# Patient Record
Sex: Female | Born: 1965 | Race: White | Hispanic: No | Marital: Married | Smoking: Current every day smoker
Health system: Southern US, Community
[De-identification: ages and names within clinical notes are randomized; demographics above are authoritative.]

## PROBLEM LIST (undated history)

## (undated) DIAGNOSIS — I10 Essential (primary) hypertension: Secondary | ICD-10-CM

## (undated) HISTORY — PX: CERVICAL BIOPSY  W/ LOOP ELECTRODE EXCISION: SUR135

---

## 2002-03-19 ENCOUNTER — Encounter: Admission: RE | Admit: 2002-03-19 | Discharge: 2002-03-19 | Payer: Self-pay | Admitting: Internal Medicine

## 2002-03-19 ENCOUNTER — Encounter: Payer: Self-pay | Admitting: Internal Medicine

## 2005-01-13 ENCOUNTER — Encounter: Admission: RE | Admit: 2005-01-13 | Discharge: 2005-01-13 | Payer: Self-pay | Admitting: Obstetrics and Gynecology

## 2006-10-16 ENCOUNTER — Encounter: Admission: RE | Admit: 2006-10-16 | Discharge: 2006-10-16 | Payer: Self-pay | Admitting: Obstetrics and Gynecology

## 2007-12-18 ENCOUNTER — Encounter: Admission: RE | Admit: 2007-12-18 | Discharge: 2007-12-18 | Payer: Self-pay | Admitting: Obstetrics and Gynecology

## 2009-02-16 ENCOUNTER — Encounter: Admission: RE | Admit: 2009-02-16 | Discharge: 2009-02-16 | Payer: Self-pay | Admitting: Obstetrics and Gynecology

## 2009-02-16 IMAGING — MG MM SCREEN MAMMOGRAM BILATERAL
5 series · 5 of 5 positions shown · non-contrast
Comparison: none

DG SCREEN MAMMOGRAM BILATERAL
Bilateral CC and MLO view(s) were taken.

DIGITAL SCREENING MAMMOGRAM WITH CAD:
There are scattered fibroglandular densities.  Possible asymmetry is noted in the left breast.  
Spot compression views and possibly sonography are recommended for further evaluation.  In the 
right breast, no masses or malignant type calcifications are identified.  Compared with prior 
studies.

[R CC]
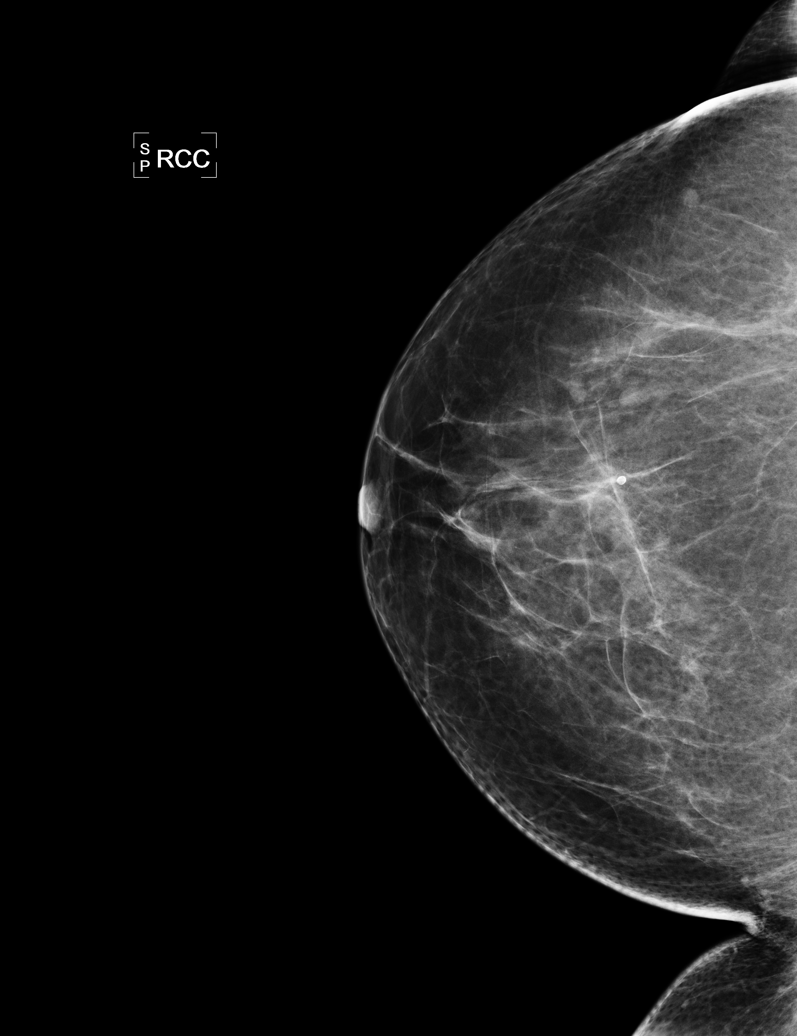

[L CC]
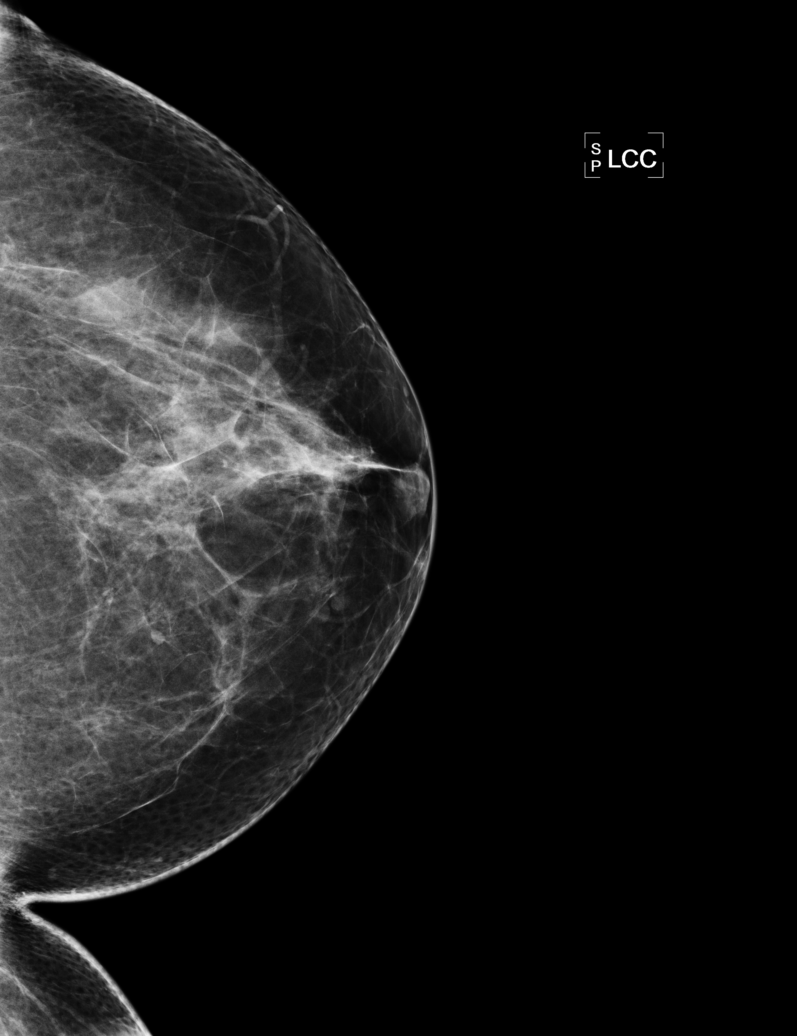

[L MLO]
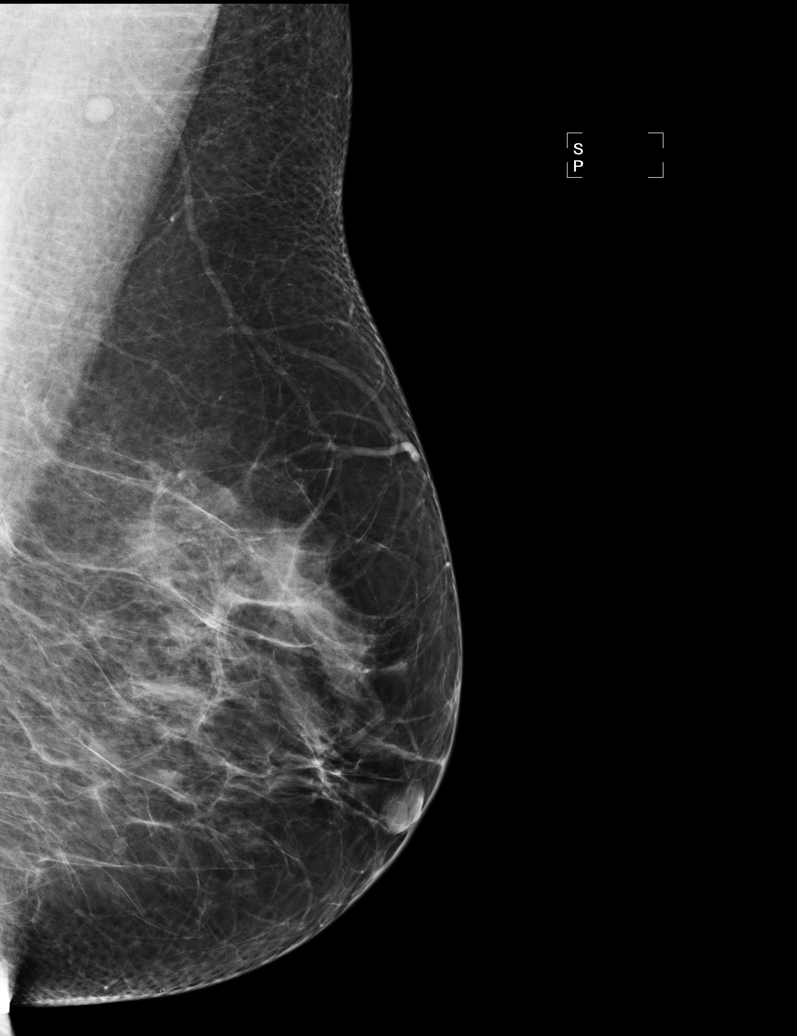

[R MLO (1 of 2)]
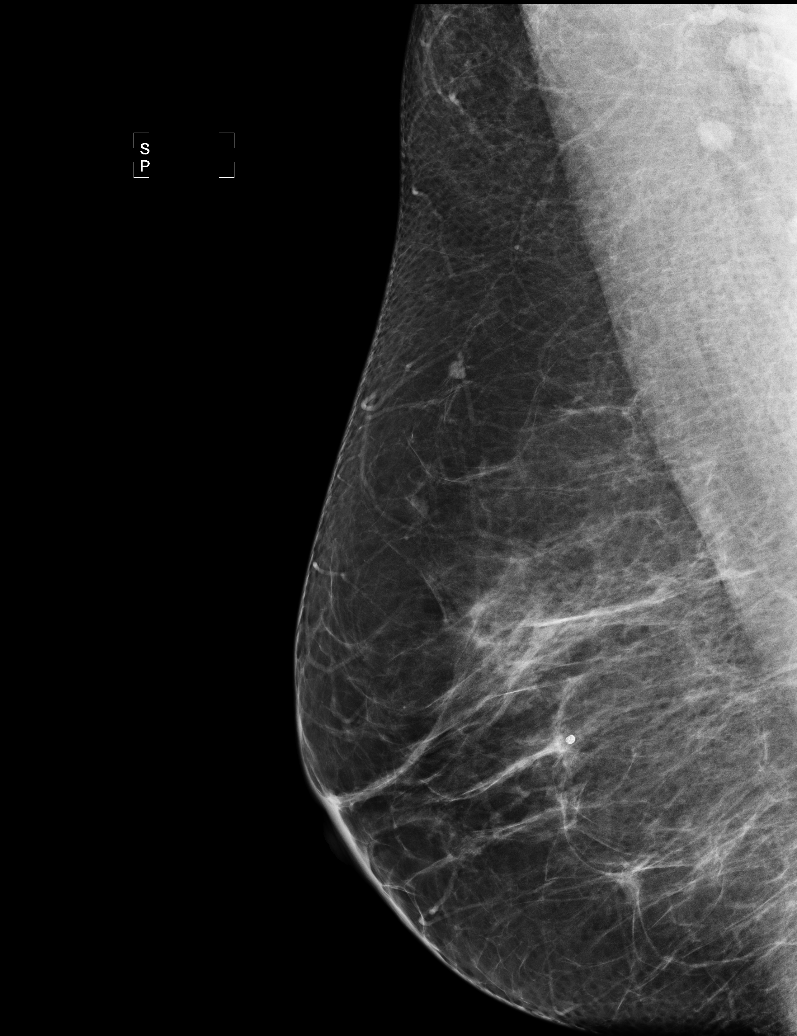

[R MLO (2 of 2)]
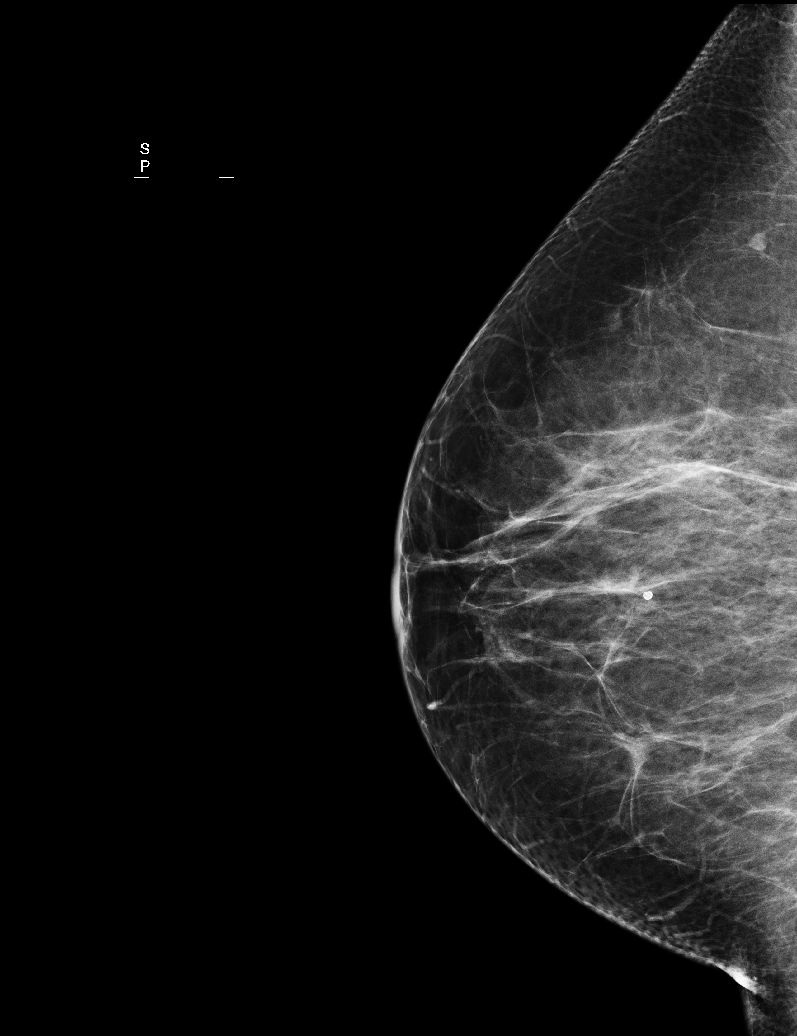

[5 of 5 positions shown; findings below may reference images not displayed]

IMPRESSION: Possible asymmetry, left breast.  Additional evaluation is indicated.  The patient will be 
contacted for additional studies and a supplementary report will follow.  No specific mammographic 
evidence of malignancy, right breast.

ASSESSMENT: Need additional imaging evaluation and/or prior mammograms for comparison - BI-RADS 0

Further imaging of the left breast.
ANALYZED BY COMPUTER AIDED DETECTION. , THIS PROCEDURE WAS A DIGITAL MAMMOGRAM.

## 2009-02-19 ENCOUNTER — Encounter: Admission: RE | Admit: 2009-02-19 | Discharge: 2009-02-19 | Payer: Self-pay | Admitting: Obstetrics and Gynecology

## 2010-03-01 ENCOUNTER — Encounter: Admission: RE | Admit: 2010-03-01 | Discharge: 2010-03-01 | Payer: Self-pay | Admitting: Obstetrics and Gynecology

## 2010-12-11 ENCOUNTER — Encounter: Payer: Self-pay | Admitting: Obstetrics and Gynecology

## 2011-08-25 ENCOUNTER — Other Ambulatory Visit: Payer: Self-pay | Admitting: Family Medicine

## 2011-08-25 DIAGNOSIS — Z1231 Encounter for screening mammogram for malignant neoplasm of breast: Secondary | ICD-10-CM

## 2011-08-29 ENCOUNTER — Ambulatory Visit: Payer: Self-pay

## 2013-09-10 ENCOUNTER — Telehealth (HOSPITAL_COMMUNITY): Payer: Self-pay | Admitting: *Deleted

## 2013-09-10 NOTE — Telephone Encounter (Signed)
Telephoned patient at home # and left message to return call to [facility] 

## 2013-09-25 ENCOUNTER — Other Ambulatory Visit: Payer: Self-pay | Admitting: Obstetrics and Gynecology

## 2013-09-25 DIAGNOSIS — Z1231 Encounter for screening mammogram for malignant neoplasm of breast: Secondary | ICD-10-CM

## 2013-10-21 ENCOUNTER — Ambulatory Visit (HOSPITAL_COMMUNITY): Payer: Self-pay

## 2013-10-21 ENCOUNTER — Ambulatory Visit (HOSPITAL_COMMUNITY): Payer: No Typology Code available for payment source | Attending: Obstetrics and Gynecology

## 2013-12-09 ENCOUNTER — Ambulatory Visit (HOSPITAL_COMMUNITY)
Admission: RE | Admit: 2013-12-09 | Discharge: 2013-12-09 | Disposition: A | Discharge status: Home / Self Care | Payer: No Typology Code available for payment source | Source: Ambulatory Visit | Attending: Obstetrics and Gynecology | Admitting: Obstetrics and Gynecology

## 2013-12-09 ENCOUNTER — Encounter (HOSPITAL_COMMUNITY): Payer: Self-pay

## 2013-12-09 VITALS — BP 144/96 | Temp 98.3°F | Ht 61.0 in | Wt 153.4 lb

## 2013-12-09 DIAGNOSIS — Z1239 Encounter for other screening for malignant neoplasm of breast: Secondary | ICD-10-CM

## 2013-12-09 DIAGNOSIS — Z1231 Encounter for screening mammogram for malignant neoplasm of breast: Secondary | ICD-10-CM

## 2013-12-09 HISTORY — DX: Essential (primary) hypertension: I10

## 2013-12-09 NOTE — Progress Notes (Signed)
No complaints today.  Pap Smear:    Pap smear not completed today. Last Pap smear was March 2013 at the free [facility] screening [facility] and normal. Per patient has a history of an abnormal Pap smear in 1989 that required cryo and LEEP for follow up. Per patient all Pap smears have bee normal since the LEEP was completed. No Pap smear results in EPIC.  Physical exam: Breasts Breasts symmetrical. No skin abnormalities bilateral breasts. No nipple retraction bilateral breasts. No nipple discharge bilateral breasts. No lymphadenopathy. No lumps palpated bilateral breasts. No complaints of pain or tenderness on exam. Patient escorted to mammography for a screening mammogram.        Pelvic/Bimanual No Pap smear completed today since last Pap smear was March 2013. Pap smear not indicated per [facility] guidelines.

## 2013-12-09 NOTE — Patient Instructions (Signed)
Taught Kathleen Brady how to perform BSE and gave educational materials to take home. Patient did not need a Pap smear today due to last Pap smear was in March 2013 per patient. Let her know [facility] will cover Pap smears every 3 years unless has a history of abnormal Pap smears. Let patient know will follow up with her within the next couple weeks with results by letter or phone. Smoking cessation discussed. Kathleen Brady verbalized understanding. Patient escorted to mammography for a screening mammogram.  Fraida Veldman, Kathaleen Maserhristine Poll, RN 8:32 AM

## 2013-12-10 ENCOUNTER — Other Ambulatory Visit: Payer: Self-pay | Admitting: Obstetrics and Gynecology

## 2013-12-10 DIAGNOSIS — R928 Other abnormal and inconclusive findings on diagnostic imaging of breast: Secondary | ICD-10-CM

## 2013-12-12 ENCOUNTER — Other Ambulatory Visit: Payer: Self-pay

## 2013-12-12 ENCOUNTER — Ambulatory Visit: Payer: Self-pay

## 2013-12-18 ENCOUNTER — Ambulatory Visit: Payer: Self-pay

## 2013-12-26 ENCOUNTER — Other Ambulatory Visit: Payer: No Typology Code available for payment source

## 2013-12-26 ENCOUNTER — Ambulatory Visit (HOSPITAL_BASED_OUTPATIENT_CLINIC_OR_DEPARTMENT_OTHER): Payer: No Typology Code available for payment source

## 2013-12-26 VITALS — BP 148/106 | HR 62 | Temp 98.8°F | Resp 18 | Ht 61.0 in | Wt 152.0 lb

## 2013-12-26 DIAGNOSIS — I10 Essential (primary) hypertension: Secondary | ICD-10-CM

## 2013-12-26 LAB — LIPID PANEL
CHOL/HDL RATIO: 4.1 ratio
Cholesterol: 239 mg/dL — ABNORMAL HIGH (ref 0–200)
HDL: 58 mg/dL (ref >39)
LDL Cholesterol: 156 mg/dL — ABNORMAL HIGH (ref 0–99)
Triglycerides: 124 mg/dL (ref <150)
VLDL: 25 mg/dL (ref 0–40)

## 2013-12-26 LAB — GLUCOSE (CC13): Glucose: 82 mg/dl (ref 70–140)

## 2013-12-26 LAB — HEMOGLOBIN A1C
Hgb A1c MFr Bld: 5.6 % (ref <5.7)
MEAN PLASMA GLUCOSE: 114 mg/dL (ref <117)

## 2013-12-26 NOTE — Patient Instructions (Signed)
Discussed health assessment with patient. Talked with patient about smoking cessation and gave resources. Signed patient up for the next smoking cessation class [facility] starting Monday, January 19, 2014. Gave patient all the dates, times and location. I told patient that would refer [facility] to [facility] for high blood pressure. Informed patient to go there after her labs have been drawn. Let patient know that will call her to follow up and if have any questions to call me. Discussed possibility of going to weigh watchers and smoking. Patient verbalized understanding.

## 2013-12-26 NOTE — Progress Notes (Signed)
Patient [facility] a [facility] and is currently a [facility] patient effective 12/09/2013.  Clinical Measurements: Patient is 5 ft. half inch, weight 151.8 lbs, waist circumference 37 inches, and hip circumference 39.5inches.   Medical History: Patient has no history of high cholesterol that she knows of. Patient does have a history of hypertension and can not remember what the medication is. She has not taken medication in 3 months.She states that BP medication breaks her out in hives. Per patient she stated meant to bring medication in today. Patient is not a diabetic and has no history of diabetes. Per patient no diagnosed history of coronary heart disease, heart attack, heart failure, stroke/TIA, vascular disease or congenital heart defects.   Blood Pressure, Self-measurement: Patient states has been shown how to take BP but does not measure daily as previously recommended. Patients states that checks it when her face feels red or that she feels BP may be up.  Nutrition Assessment: Patient stated that she drinks around two glasses of orange juice every day. Patient states she eata at least 2 cups of vegetables  daily. Patient states that eats 3 oz of of whole grains a week. Patient doesn't eat two or more servings of fish weekly was not good one day. She stated that gets stressed.. Patient states she does drink more than 36 ounces or 450 calories of beverages with added sugars weekly. Patient stated she does watch her salt intake not adding salt to foods due to her history of hypertension.   Physical Activity Assessment: Patient stated she does a lot of walking and working around the house.patient states that does not get any vigorous exercising done a week.  Smoking Status: Patient is a smoker and smoke a half to one pack per day of cigerettes. Patient is exposed to smoke a 1 hour a day. Patient asked to be signed  Up for smoking cessation class starting March 2.  Quality of Life  Assessment: In assessing patient's quality of life she stated that out of the past 30 days that she has felt her health is good all of them. Patient also stated that in the past 30 days that her mental health is not good including stress, depression and problems with emotions. Patient stated that she has  depression and is currently on Celexa 40 mg everyday and Xanax 25 mg every 24 hrs.as needed. 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 Patient did stated that out of the past 30 days that she felt none of her physical or mental health kept her from doing her usual activities including self-care, work or recreation.   Plan: Lab work will be done today including a lipid panel, blood glucose, and Hgb A1C. Will call lab results when they are finished. Patient will returned on February 12th for Health Coaching. After reviewing labs will schedule [facility] for Hypertension and whatever is needed.

## 2013-12-29 ENCOUNTER — Telehealth: Payer: Self-pay

## 2013-12-29 NOTE — Telephone Encounter (Addendum)
Called to inform about lab work from 12/26/13. I informed patient on following labs : cholesterol- 239, HDL- 58, LDL- 156,  triglycerides - 124, Bld Glucose -81 and HBG-A1C - 5.6.   Informed her that I had call for an appointment at [facility] and [facility]. They stated that would get back with pstient with an appointment. We changed her Health Coaching from the 12 th due to breast imaging on the 12 th until 01/08/14 @2pm .

## 2014-01-01 ENCOUNTER — Ambulatory Visit
Admission: RE | Admit: 2014-01-01 | Discharge: 2014-01-01 | Disposition: A | Discharge status: Home / Self Care | Payer: Self-pay | Source: Ambulatory Visit | Attending: Obstetrics and Gynecology | Admitting: Obstetrics and Gynecology

## 2014-01-01 ENCOUNTER — Ambulatory Visit
Admission: RE | Admit: 2014-01-01 | Discharge: 2014-01-01 | Disposition: A | Discharge status: Home / Self Care | Payer: No Typology Code available for payment source | Source: Ambulatory Visit | Attending: Obstetrics and Gynecology | Admitting: Obstetrics and Gynecology

## 2014-01-01 DIAGNOSIS — R928 Other abnormal and inconclusive findings on diagnostic imaging of breast: Secondary | ICD-10-CM

## 2014-01-08 ENCOUNTER — Telehealth: Payer: Self-pay

## 2014-01-08 ENCOUNTER — Encounter: Payer: Self-pay | Admitting: Internal Medicine

## 2014-01-08 ENCOUNTER — Ambulatory Visit: Payer: Self-pay | Attending: Internal Medicine | Admitting: Internal Medicine

## 2014-01-08 ENCOUNTER — Ambulatory Visit (HOSPITAL_BASED_OUTPATIENT_CLINIC_OR_DEPARTMENT_OTHER): Payer: No Typology Code available for payment source

## 2014-01-08 VITALS — BP 176/117 | HR 86 | Temp 98.9°F | Resp 14 | Ht 61.0 in | Wt 150.0 lb

## 2014-01-08 VITALS — BP 138/86

## 2014-01-08 DIAGNOSIS — I10 Essential (primary) hypertension: Secondary | ICD-10-CM

## 2014-01-08 DIAGNOSIS — Z888 Allergy status to other drugs, medicaments and biological substances status: Secondary | ICD-10-CM | POA: Insufficient documentation

## 2014-01-08 DIAGNOSIS — Z0000 Encounter for general adult medical examination without abnormal findings: Secondary | ICD-10-CM

## 2014-01-08 DIAGNOSIS — Z789 Other specified health status: Secondary | ICD-10-CM

## 2014-01-08 DIAGNOSIS — F17200 Nicotine dependence, unspecified, uncomplicated: Secondary | ICD-10-CM | POA: Insufficient documentation

## 2014-01-08 LAB — COMPLETE METABOLIC PANEL WITH GFR
ALT: 25 U/L (ref 0–35)
AST: 23 U/L (ref 0–37)
Albumin: 4.5 g/dL (ref 3.5–5.2)
Alkaline Phosphatase: 110 U/L (ref 39–117)
BILIRUBIN TOTAL: 0.4 mg/dL (ref 0.2–1.2)
BUN: 13 mg/dL (ref 6–23)
CHLORIDE: 104 meq/L (ref 96–112)
CO2: 30 mEq/L (ref 19–32)
CREATININE: 0.74 mg/dL (ref 0.50–1.10)
Calcium: 9.3 mg/dL (ref 8.4–10.5)
GFR, Est African American: >89 mL/min
GFR, Est Non African American: >89 mL/min
Glucose, Bld: 91 mg/dL (ref 70–99)
Potassium: 4.6 mEq/L (ref 3.5–5.3)
Sodium: 141 mEq/L (ref 135–145)
Total Protein: 7.3 g/dL (ref 6.0–8.3)

## 2014-01-08 LAB — CBC WITH DIFFERENTIAL/PLATELET
BASOS ABS: 0 10*3/uL (ref 0.0–0.1)
Basophils Relative: 0 % (ref 0–1)
EOS ABS: 0.2 10*3/uL (ref 0.0–0.7)
EOS PCT: 2 % (ref 0–5)
HCT: 46.8 % — ABNORMAL HIGH (ref 36.0–46.0)
Hemoglobin: 15.6 g/dL — ABNORMAL HIGH (ref 12.0–15.0)
LYMPHS PCT: 35 % (ref 12–46)
Lymphs Abs: 2.8 10*3/uL (ref 0.7–4.0)
MCH: 31.8 pg (ref 26.0–34.0)
MCHC: 33.3 g/dL (ref 30.0–36.0)
MCV: 95.3 fL (ref 78.0–100.0)
Monocytes Absolute: 0.6 10*3/uL (ref 0.1–1.0)
Monocytes Relative: 7 % (ref 3–12)
NEUTROS PCT: 56 % (ref 43–77)
Neutro Abs: 4.4 10*3/uL (ref 1.7–7.7)
PLATELETS: 262 10*3/uL (ref 150–400)
RBC: 4.91 MIL/uL (ref 3.87–5.11)
RDW: 13.4 % (ref 11.5–15.5)
WBC: 7.9 10*3/uL (ref 4.0–10.5)

## 2014-01-08 LAB — TSH: TSH: 0.958 u[IU]/mL (ref 0.350–4.500)

## 2014-01-08 MED ORDER — CLONIDINE HCL 0.1 MG PO TABS: 0.2000 mg | ORAL_TABLET | Freq: Once | ORAL | Status: DC

## 2014-01-08 MED ORDER — HYDROCHLOROTHIAZIDE 25 MG PO TABS: 25.0000 mg | ORAL_TABLET | Freq: Every day | ORAL | Status: DC

## 2014-01-08 NOTE — Progress Notes (Signed)
Patient is here to establish care. Has a history of Hypertension. Has been off medication x9 months. BP today 176/117. Patient may have an allergic reaction to Lisinopril; arms broke out in hives and swelling. Patient requests to be put back on Hypertension medication.

## 2014-01-08 NOTE — Progress Notes (Signed)
Patient ID: Kathleen Brady, female   DOB: February 17, 1966, 48 y.o.   MRN: [id number]   CC:  HPI: 48 year old he will here to establish care. She was taking lisinopril and developed an allergic reaction including hives and stopped taking it. Blood pressure is elevated today in the 170s. She just received clonidine 0.2 mg. I reviewed her cholesterol panel and her mammogram on 12/09/13. Since then the patient has had a repeat breast ultrasound and was told that she has fibrocystic disease. She supposed to get a repeat ultrasound in 6 months.  She is a smoker and smokes a pack a day for the last 15 years. Denies use of any alcohol Family history reviewed and the patient states that there is no significant family history to report  Patient stated she [facility], she is due for it next year.   Allergies  Allergen Reactions  . Lisinopril Hives and Swelling  . Tylox [Oxycodone-Acetaminophen]    Past Medical History  Diagnosis Date  . Hypertension    No current outpatient prescriptions on file prior to visit.   No current facility-administered medications on file prior to visit.   Family History  Problem Relation Age of Onset  . Breast cancer Maternal Aunt   . Breast cancer Maternal Aunt    History   Social History  . Marital Status: Married    Spouse Name: N/A    Number of Children: N/A  . Years of Education: N/A   Occupational History  . Not on file.   Social History Main Topics  . Smoking status: Current Every Day Smoker -- 0.50 packs/day for 22 years  . Smokeless tobacco: Never Used  . Alcohol Use: No  . Drug Use: No  . Sexual Activity: Not Currently    Birth Control/ Protection: None   Other Topics Concern  . Not on file   Social History Narrative  . No narrative on file    Review of Systems  Constitutional: As in history of present illness  HENT: Negative for ear pain, nosebleeds, congestion, facial swelling, rhinorrhea,  neck pain, neck stiffness and ear discharge.   Eyes: Negative for pain, discharge, redness, itching and visual disturbance.  Respiratory: Negative for cough, choking, chest tightness, shortness of breath, wheezing and stridor.   Cardiovascular: Negative for chest pain, palpitations and leg swelling.  Gastrointestinal: Negative for abdominal distention.  Genitourinary: Negative for dysuria, urgency, frequency, hematuria, flank pain, decreased urine volume, difficulty urinating and dyspareunia.  Musculoskeletal: Negative for back pain, joint swelling, arthralgias and gait problem.  Neurological: Negative for dizziness, tremors, seizures, syncope, facial asymmetry, speech difficulty, weakness, light-headedness, numbness and headaches.  Hematological: Negative for adenopathy. Does not bruise/bleed easily.  Psychiatric/Behavioral: Negative for hallucinations, behavioral problems, confusion, dysphoric mood, decreased concentration and agitation.    Objective:   Filed Vitals:   01/08/14 1211  BP: 176/117  Pulse: 86  Temp: 98.9 F (37.2 C)  Resp: 14    Physical Exam  Constitutional: Appears well-developed and well-nourished. No distress.  HENT: Normocephalic. External right and left ear normal. Oropharynx is clear and moist.  Eyes: Conjunctivae and EOM are normal. PERRLA, no scleral icterus.  Neck: Normal ROM. Neck supple. No JVD. No tracheal deviation. No thyromegaly.  CVS: RRR, S1/S2 +, no murmurs, no gallops, no carotid bruit.  Pulmonary: Effort and breath sounds normal, no stridor, rhonchi, wheezes, rales.  Abdominal: Soft. BS +,  no distension, tenderness, rebound or guarding.  Musculoskeletal:  Normal range of motion. No edema and no tenderness.  Lymphadenopathy: No lymphadenopathy noted, cervical, inguinal. Neuro: Alert. Normal reflexes, muscle tone coordination. No cranial nerve deficit. Skin: Skin is warm and dry. No rash noted. Not diaphoretic. No erythema. No pallor.   Psychiatric: Normal mood and affect. Behavior, judgment, thought content normal.   No results found for this basename: WBC, HGB, HCT, MCV, PLT   No results found for this basename: CREATININE, BUN, NA, K, CL, CO2    Lab Results  Component Value Date   HGBA1C 5.6 12/26/2013   Lipid Panel     Component Value Date/Time   CHOL 239* 12/26/2013 1459   TRIG 124 12/26/2013 1459   HDL 58 12/26/2013 1459   CHOLHDL 4.1 12/26/2013 1459   VLDL 25 12/26/2013 1459   LDLCALC 156* 12/26/2013 1459       Assessment and plan:   There are no active problems to display for this patient.  Hypertension Switch patient to hydrochlorothiazide Will schedule her for repeat CMP in 2 weeks Smoking cessation counseling done   Establish care Baseline labs Pap smear due next year Repeat breast ultrasound due in 6 months and mammogram in one year Up-to-date with hepatitis B Refuses flu vaccination Would like to receive tetanus vaccination today     Follow up in 3 months   The patient was given clear instructions to go to ER or return to medical center if symptoms don't improve, worsen or new problems develop. The patient verbalized understanding. The patient was told to call to get any lab results if not heard anything in the next week.

## 2014-01-08 NOTE — Progress Notes (Signed)
Patient returns today for Health Coaching regarding Hypertension, Nutrition, and Cholesterol.  HYPERTENSION: Per patient she went to [facility] at 11:30 today and her BP was very elevated (176/117). Stated that took BP twice and this result was the lower one. Per patient they gave her a BP medication but did not recheck blood pressure before left office. Rechecked BP at this time was 138/86. Discussed hypertension: cause, effects, treatment, healthy weight, eating right and medication. She was given clonidine 0.2mg  at office and was to start daily HCTZ 25 mg daily. We discussed how the  Medication works. Patient was given the following pamphlets: Your Guide to Lowering Blood Pressure, Dash diet and pill box. We looked at pamphlets and discussed. Patient informed about not taking HCTZ in evening due to would be up voiding all night, possibly. Discussed about getting BP monitor to check BP daily.  NUTRITION: Patient states that would like to go to Weight Watchers. She was given handouts with locations nearest her house. Informed her a little on how Weight Watchers works. Stated that I would have to get back to her when I obtained voucher.  SMOKING CESSATION: Per patient states that has got to quit smoking. Patient is signed up to start QuitSmart class on March 2nd. Discussed a little about the class from patient's questions.  CHOLESTEROL AND LDL's: Gave patient two handouts: HDL Cholesterol: How to boost your good cholesterol and Top Five Foods to Lower Your Numbers. Reviewed all handouts together and asked questions. Patient does not like fish so she asked about an omega-3 supplement. Informed that it probably would be good. Ask doctor to make sure.  HEALTH CARE: Discussed the need for Primary Care Physican (PCP).  [facility]. Discussed the process of obtaining a [employer] Card [employer](Orange Card).

## 2014-01-08 NOTE — Telephone Encounter (Signed)
Patient called to say that she was going to [facility] at [facility] and would be at Health Coaching for [facility] today (01/08/14).

## 2014-01-08 NOTE — Patient Instructions (Addendum)
PLAN: Receive Weight Watchers voucher and start program for 12 weeks. Obtain an Orange card for PCP. Obtain BP monitor and learn how to check blood pressure. Work on reducing Cholesterol. Monitor activity with pedometer. Monitor salt intake. Attend smoking cessation classes. Quit Smoking. Lose to a healthy weight. Will call for follow up talk and  then half way through Weight Watchers meet again. Follow up phone call after Weight Watchers. Patient stated that she understood. Was told could call me for any further questions.

## 2014-01-09 ENCOUNTER — Telehealth: Payer: Self-pay | Admitting: *Deleted

## 2014-01-09 LAB — VITAMIN D 25 HYDROXY (VIT D DEFICIENCY, FRACTURES): VIT D 25 HYDROXY: 15 ng/mL — AB (ref 30–89)

## 2014-01-09 MED ORDER — VITAMIN D (ERGOCALCIFEROL) 1.25 MG (50000 UNIT) PO CAPS: 50000.0000 [IU] | ORAL_CAPSULE | ORAL | Status: DC

## 2014-01-09 NOTE — Telephone Encounter (Signed)
Left patient a voicemail to return our call. 

## 2014-01-09 NOTE — Telephone Encounter (Signed)
Message copied by Ranon Coven, [place] R on Fri Jan 09, 2014 12:06 PM ------      Message from: Susie CassetteABROL MD, Germain OsgoodNAYANA      Created: Fri Jan 09, 2014 11:54 AM       Patient labs are normal, vitamin D 15, please call in a prescription for vitamin D 50,000 units weekly, 10 tablets with 2 refills ------

## 2014-01-15 ENCOUNTER — Other Ambulatory Visit: Payer: No Typology Code available for payment source

## 2014-01-19 ENCOUNTER — Telehealth: Payer: Self-pay | Admitting: *Deleted

## 2014-01-19 NOTE — Telephone Encounter (Signed)
Patient called wanting to know her labs from 2\19. It looks like patient was called and LVM to call back. She was given message from 2\20 in regards to this

## 2014-04-03 ENCOUNTER — Other Ambulatory Visit: Payer: Self-pay | Admitting: Internal Medicine

## 2014-04-06 ENCOUNTER — Other Ambulatory Visit: Payer: Self-pay | Admitting: Internal Medicine

## 2014-04-06 DIAGNOSIS — I10 Essential (primary) hypertension: Secondary | ICD-10-CM

## 2014-04-06 DIAGNOSIS — E559 Vitamin D deficiency, unspecified: Secondary | ICD-10-CM

## 2014-04-06 NOTE — Telephone Encounter (Signed)
Pt called requesting a refill of her blood pressure medication and her Vitamin D medication. Please contact pt

## 2014-04-07 MED ORDER — VITAMIN D (ERGOCALCIFEROL) 1.25 MG (50000 UNIT) PO CAPS: 50000.0000 [IU] | ORAL_CAPSULE | ORAL | Status: AC

## 2014-04-07 MED ORDER — HYDROCHLOROTHIAZIDE 25 MG PO TABS: 25.0000 mg | ORAL_TABLET | Freq: Every day | ORAL | Status: DC

## 2014-05-06 ENCOUNTER — Telehealth: Payer: Self-pay

## 2014-05-06 NOTE — Telephone Encounter (Signed)
Patient called to state that could not arrange work schedule so could come to smoking cessation classes. Patient asked to be signed up for next class. Explained to patient that weight watcher vouchers had not come in yet.

## 2014-05-19 ENCOUNTER — Telehealth: Payer: Self-pay

## 2014-05-19 NOTE — Telephone Encounter (Signed)
Left message, need to discuss Weight Watchers and Smoking Cessation

## 2014-05-21 ENCOUNTER — Telehealth: Payer: Self-pay

## 2014-05-21 NOTE — Telephone Encounter (Signed)
Returned call and patient stated that still want to quit smoking by coming to class. When asked about Weight Watchers patient stated that would still like to go .   PLAN: Made appointment for health coaching follow up for Weight Watchers and smoking.

## 2014-05-29 ENCOUNTER — Ambulatory Visit: Payer: No Typology Code available for payment source

## 2014-06-01 ENCOUNTER — Other Ambulatory Visit: Payer: Self-pay

## 2014-06-01 ENCOUNTER — Other Ambulatory Visit: Payer: Self-pay | Admitting: Obstetrics and Gynecology

## 2014-06-01 DIAGNOSIS — N6320 Unspecified lump in the left breast, unspecified quadrant: Secondary | ICD-10-CM

## 2014-06-30 ENCOUNTER — Other Ambulatory Visit: Payer: Self-pay | Admitting: Obstetrics and Gynecology

## 2014-06-30 ENCOUNTER — Other Ambulatory Visit: Payer: Self-pay

## 2014-06-30 DIAGNOSIS — N6320 Unspecified lump in the left breast, unspecified quadrant: Secondary | ICD-10-CM

## 2014-07-03 ENCOUNTER — Other Ambulatory Visit: Payer: No Typology Code available for payment source

## 2014-07-16 ENCOUNTER — Other Ambulatory Visit: Payer: No Typology Code available for payment source

## 2014-07-22 ENCOUNTER — Other Ambulatory Visit: Payer: No Typology Code available for payment source

## 2014-08-03 ENCOUNTER — Other Ambulatory Visit: Payer: No Typology Code available for payment source

## 2014-08-26 ENCOUNTER — Other Ambulatory Visit: Payer: No Typology Code available for payment source

## 2014-09-10 ENCOUNTER — Other Ambulatory Visit: Payer: No Typology Code available for payment source

## 2014-09-21 ENCOUNTER — Encounter: Payer: Self-pay | Admitting: Internal Medicine

## 2014-10-12 ENCOUNTER — Other Ambulatory Visit: Payer: No Typology Code available for payment source

## 2014-10-27 ENCOUNTER — Other Ambulatory Visit: Payer: No Typology Code available for payment source

## 2014-11-18 ENCOUNTER — Other Ambulatory Visit: Payer: No Typology Code available for payment source

## 2014-11-30 ENCOUNTER — Other Ambulatory Visit: Payer: No Typology Code available for payment source

## 2014-11-30 ENCOUNTER — Inpatient Hospital Stay: Admission: RE | Admit: 2014-11-30 | Payer: No Typology Code available for payment source | Source: Ambulatory Visit

## 2015-02-02 ENCOUNTER — Telehealth (HOSPITAL_COMMUNITY): Payer: Self-pay | Admitting: *Deleted

## 2015-02-02 NOTE — Telephone Encounter (Signed)
[facility] Appointment scheduled for 02/11/2015 at 1430.

## 2015-02-03 ENCOUNTER — Other Ambulatory Visit (HOSPITAL_COMMUNITY): Payer: Self-pay | Admitting: *Deleted

## 2015-02-03 DIAGNOSIS — Z09 Encounter for follow-up examination after completed treatment for conditions other than malignant neoplasm: Secondary | ICD-10-CM

## 2015-02-11 ENCOUNTER — Ambulatory Visit (HOSPITAL_COMMUNITY)
Admission: RE | Admit: 2015-02-11 | Discharge: 2015-02-11 | Disposition: A | Discharge status: Home / Self Care | Payer: No Typology Code available for payment source | Source: Ambulatory Visit | Attending: Obstetrics and Gynecology | Admitting: Obstetrics and Gynecology

## 2015-02-11 ENCOUNTER — Ambulatory Visit
Admission: RE | Admit: 2015-02-11 | Discharge: 2015-02-11 | Disposition: A | Discharge status: Home / Self Care | Payer: No Typology Code available for payment source | Source: Ambulatory Visit | Attending: Obstetrics and Gynecology | Admitting: Obstetrics and Gynecology

## 2015-02-11 ENCOUNTER — Encounter (HOSPITAL_COMMUNITY): Payer: Self-pay

## 2015-02-11 VITALS — BP 140/82 | Temp 98.3°F | Ht 61.0 in | Wt 150.6 lb

## 2015-02-11 DIAGNOSIS — Z09 Encounter for follow-up examination after completed treatment for conditions other than malignant neoplasm: Secondary | ICD-10-CM

## 2015-02-11 DIAGNOSIS — Z01419 Encounter for gynecological examination (general) (routine) without abnormal findings: Secondary | ICD-10-CM

## 2015-02-11 NOTE — Patient Instructions (Addendum)
Explained to Kathleen Brady that [facility] will cover Pap smears and co-testing every 5 years unless has a history of abnormal Pap smears. [facility] mamm[facility] and left breast ultrasound per recommendation. Appointment scheduled for Thursday, February 11, 2015 at 1550. Patient aware of appointment and will be there. Let patient know will follow up with her within the next couple weeks with results of Pap smear by phone. Smoking cessation discussed with patient and resources given. Kathleen Brady verbalized understanding.

## 2015-02-11 NOTE — Progress Notes (Signed)
No complaints today.  Pap Smear:  Pap smear completed today. Last Pap smear was March 2013 at the free cervical cancer screening at [facility] and normal. Per patient has a history of an abnormal Pap smear in 1989 that required cryo and LEEP for follow up. Per patient all Pap smears have bee normal since the LEEP was completed. No Pap smear results in EPIC.  Physical exam: Breasts Breasts symmetrical. No skin abnormalities bilateral breasts. No nipple retraction bilateral breasts. No nipple discharge bilateral breasts. No lymphadenopathy. No lumps palpated bilateral breasts. Unable to palpate areas of concern on mammogram. Complaints of right inner breast tenderness on exam. [facility] for diagnostic mamm[facility] and left breast ultrasound per recommendation. Appointment scheduled for Thursday, February 11, 2015 at 1550.         Pelvic/Bimanual   Ext Genitalia No lesions, no swelling and no discharge observed on external genitalia.         Vagina Vagina pink and normal texture. No lesions or discharge observed in vagina.          Cervix Cervix is present. Cervix pink and of normal texture. No discharge observed.     Uterus Uterus is present and palpable. Uterus in normal position and normal size.        Adnexae Bilateral ovaries present and palpable. No tenderness on palpation.          Rectovaginal No rectal exam completed today since patient had no rectal complaints. No skin abnormalities observed on exam.      Smoking cessation discussed with patient. Referred [facility] to the [facility] and resources given for free smoking cessation classes at the [facility].

## 2015-02-12 LAB — CYTOLOGY - PAP

## 2015-02-22 ENCOUNTER — Telehealth (HOSPITAL_COMMUNITY): Payer: Self-pay | Admitting: *Deleted

## 2015-02-22 NOTE — Telephone Encounter (Signed)
Telephoned patient at home # and left message to return call to [facility] 

## 2015-03-12 ENCOUNTER — Telehealth (HOSPITAL_COMMUNITY): Payer: Self-pay | Admitting: *Deleted

## 2015-03-12 NOTE — Telephone Encounter (Signed)
Telephoned patient at home # and left message to return call to [facility] 

## 2015-03-15 ENCOUNTER — Other Ambulatory Visit: Payer: Self-pay | Admitting: Internal Medicine

## 2015-03-16 ENCOUNTER — Telehealth (HOSPITAL_COMMUNITY): Payer: Self-pay | Admitting: *Deleted

## 2015-03-16 NOTE — Telephone Encounter (Signed)
Patient returned called. Advised patient pap smear was abnormal but HPV was negative. Guidelines state pap smear in one year. Patient voiced understanding.

## 2015-04-10 ENCOUNTER — Other Ambulatory Visit: Payer: Self-pay | Admitting: Internal Medicine

## 2015-04-15 ENCOUNTER — Other Ambulatory Visit: Payer: Self-pay | Admitting: *Deleted

## 2015-04-16 ENCOUNTER — Other Ambulatory Visit: Payer: Self-pay | Admitting: *Deleted

## 2015-04-16 DIAGNOSIS — I10 Essential (primary) hypertension: Secondary | ICD-10-CM

## 2015-04-16 MED ORDER — CLONIDINE HCL 0.1 MG PO TABS: 0.2000 mg | ORAL_TABLET | Freq: Once | ORAL | Status: DC

## 2015-04-16 MED ORDER — HYDROCHLOROTHIAZIDE 25 MG PO TABS: 25.0000 mg | ORAL_TABLET | Freq: Every day | ORAL | Status: DC

## 2015-04-21 ENCOUNTER — Telehealth: Payer: Self-pay | Admitting: Obstetrics and Gynecology

## 2015-04-21 ENCOUNTER — Other Ambulatory Visit: Payer: Self-pay | Admitting: *Deleted

## 2015-04-21 DIAGNOSIS — I10 Essential (primary) hypertension: Secondary | ICD-10-CM

## 2015-04-21 MED ORDER — HYDROCHLOROTHIAZIDE 25 MG PO TABS: 25.0000 mg | ORAL_TABLET | Freq: Every day | ORAL | Status: AC

## 2015-04-21 MED ORDER — CLONIDINE HCL 0.1 MG PO TABS: 0.2000 mg | ORAL_TABLET | Freq: Once | ORAL | Status: AC

## 2015-04-21 NOTE — Telephone Encounter (Signed)
Patient has an appointment on June 8th to establish care with [facility].  Patient requesting enough blood pressure medication to get her through to her next appointment.

## 2015-04-21 NOTE — Telephone Encounter (Signed)
Patient called to request a med refill on her blood pressure medication, patient needs enough to last her until her upcoming appointment. [facility] on [address] Please f/u with pt.

## 2015-04-28 ENCOUNTER — Ambulatory Visit: Payer: Self-pay | Admitting: Internal Medicine

## 2015-05-05 ENCOUNTER — Other Ambulatory Visit: Payer: Self-pay | Admitting: Internal Medicine

## 2015-09-01 ENCOUNTER — Ambulatory Visit (INDEPENDENT_AMBULATORY_CARE_PROVIDER_SITE_OTHER): Payer: BLUE CROSS/BLUE SHIELD | Admitting: Physician Assistant

## 2015-09-01 VITALS — BP 138/96 | HR 91 | Temp 98.2°F | Resp 16 | Ht 61.0 in | Wt 145.8 lb

## 2015-09-01 DIAGNOSIS — J069 Acute upper respiratory infection, unspecified: Secondary | ICD-10-CM | POA: Diagnosis not present

## 2015-09-01 MED ORDER — HYDROCOD POLST-CPM POLST ER 10-8 MG/5ML PO SUER: 5.0000 mL | Freq: Every evening | ORAL | Status: AC | PRN

## 2015-09-01 MED ORDER — BENZONATATE 100 MG PO CAPS: 100.0000 mg | ORAL_CAPSULE | Freq: Three times a day (TID) | ORAL | Status: AC | PRN

## 2015-09-01 MED ORDER — IPRATROPIUM BROMIDE 0.03 % NA SOLN: 2.0000 | Freq: Two times a day (BID) | NASAL | Status: DC

## 2015-09-01 NOTE — Patient Instructions (Signed)
Upper Respiratory Infection, Adult Most upper respiratory infections (URIs) are a viral infection of the air passages leading to the lungs. A URI affects the nose, throat, and upper air passages. The most common type of URI is nasopharyngitis and is typically referred to as "the common cold." URIs run their course and usually go away on their own. Most of the time, a URI does not require medical attention, but sometimes a bacterial infection in the upper airways can follow a viral infection. This is called a secondary infection. Sinus and middle ear infections are common types of secondary upper respiratory infections. Bacterial pneumonia can also complicate a URI. A URI can worsen asthma and chronic obstructive pulmonary disease (COPD). Sometimes, these complications can require emergency medical care and may be life threatening.  CAUSES Almost all URIs are caused by viruses. A virus is a type of germ and can spread from one person to another.  RISKS FACTORS You may be at risk for a URI if:   You smoke.   You have chronic heart or lung disease.  You have a weakened defense (immune) system.   You are very young or very old.   You have nasal allergies or asthma.  You work in crowded or poorly ventilated areas.  You work in health care facilities or schools. SIGNS AND SYMPTOMS  Symptoms typically develop 2-3 days after you come in contact with a cold virus. Most viral URIs last 7-10 days. However, viral URIs from the influenza virus (flu virus) can last 14-18 days and are typically more severe. Symptoms may include:   Runny or stuffy (congested) nose.   Sneezing.   Cough.   Sore throat.   Headache.   Fatigue.   Fever.   Loss of appetite.   Pain in your forehead, behind your eyes, and over your cheekbones (sinus pain).  Muscle aches.  DIAGNOSIS  Your health care provider may diagnose a URI by:  Physical exam.  Tests to check that your symptoms are not due to  another condition such as:  Strep throat.  Sinusitis.  Pneumonia.  Asthma. TREATMENT  A URI goes away on its own with time. It cannot be cured with medicines, but medicines may be prescribed or recommended to relieve symptoms. Medicines may help:  Reduce your fever.  Reduce your cough.  Relieve nasal congestion. HOME CARE INSTRUCTIONS   Take medicines only as directed by your health care provider.   Gargle warm saltwater or take cough drops to comfort your throat as directed by your health care provider.  Use a warm mist humidifier or inhale steam from a shower to increase air moisture. This may make it easier to breathe.  Drink enough fluid to keep your urine clear or pale yellow.   Eat soups and other clear broths and maintain good nutrition.   Rest as needed.   Return to work when your temperature has returned to normal or as your health care provider advises. You may need to stay home longer to avoid infecting others. You can also use a face mask and careful hand washing to prevent spread of the virus.  Increase the usage of your inhaler if you have asthma.   Do not use any tobacco products, including cigarettes, chewing tobacco, or electronic cigarettes. If you need help quitting, ask your health care provider. PREVENTION  The best way to protect yourself from getting a cold is to practice good hygiene.   Avoid oral or hand contact with people with cold   symptoms.   Wash your hands often if contact occurs.  There is no clear evidence that vitamin C, vitamin E, echinacea, or exercise reduces the chance of developing a cold. However, it is always recommended to get plenty of rest, exercise, and practice good nutrition.  SEEK MEDICAL CARE IF:   You are getting worse rather than better.   Your symptoms are not controlled by medicine.   You have chills.  You have worsening shortness of breath.  You have brown or red mucus.  You have yellow or brown nasal  discharge.  You have pain in your face, especially when you bend forward.  You have a fever.  You have swollen neck glands.  You have pain while swallowing.  You have white areas in the back of your throat. SEEK IMMEDIATE MEDICAL CARE IF:   You have severe or persistent:  Headache.  Ear pain.  Sinus pain.  Chest pain.  You have chronic lung disease and any of the following:  Wheezing.  Prolonged cough.  Coughing up blood.  A change in your usual mucus.  You have a stiff neck.  You have changes in your:  Vision.  Hearing.  Thinking.  Mood. MAKE SURE YOU:   Understand these instructions.  Will watch your condition.  Will get help right away if you are not doing well or get worse.   This information is not intended to replace advice given to you by your health care provider. Make sure you discuss any questions you have with your health care provider.   Document Released: 05/02/2001 Document Revised: 03/23/2015 Document Reviewed: 02/11/2014 Elsevier Interactive Patient Education 2016 [employer]  

## 2015-09-01 NOTE — Progress Notes (Signed)
   Subjective:    Patient ID: Kathleen Brady, female    DOB: 02/11/1966, 49 y.o.   MRN: [id number]  HPI Patient presents for sore throat and productive cough that have been present for the past week. Still able to swallow ok, but uncomfortable. Cough is keeping her up at night. Additionally endorses headache, congestion, sneezing, and otalgia. Denies fever, N/V, rhinorrhea, SOB/CP. Has not tried any OTC medications.   BP elevated. States that she has not taken HCTZ today.   Med allergy to lisinopril and tylox. States that has had hydrocodone in the past without reaction. Tylox made her nauseous.   Review of Systems As noted above.     Objective:   Physical Exam  Constitutional: She is oriented to person, place, and time. She appears well-developed and well-nourished. No distress.  Blood pressure 138/96, pulse 91, temperature 98.2 F (36.8 C), temperature source Oral, resp. rate 16, height 5\' 1"  (1.549 m), weight 145 lb 12.8 oz (66.134 kg), last menstrual period 11/14/2013, SpO2 93 %.   HENT:  Head: Normocephalic and atraumatic.  Right Ear: Ear canal normal. No drainage or tenderness. Tympanic membrane is scarred. Tympanic membrane is not injected, not erythematous, not retracted and not bulging. No middle ear effusion.  Left Ear: External ear and ear canal normal. No drainage or tenderness. Tympanic membrane is scarred. Tympanic membrane is not injected, not erythematous, not retracted and not bulging.  No middle ear effusion.  Nose: Rhinorrhea (with erythema) present. Right sinus exhibits no maxillary sinus tenderness and no frontal sinus tenderness. Left sinus exhibits no maxillary sinus tenderness and no frontal sinus tenderness.  Mouth/Throat: Uvula is midline and mucous membranes are normal. Posterior oropharyngeal erythema present. No oropharyngeal exudate or posterior oropharyngeal edema.  Eyes: Conjunctivae are normal. Pupils are equal, round, and reactive to light. Right eye  exhibits discharge (watery). Left eye exhibits discharge (watery). No scleral icterus.  Neck: Normal range of motion. Neck supple. No thyromegaly present.  Cardiovascular: Normal rate, regular rhythm and normal heart sounds.  Exam reveals no gallop and no friction rub.   No murmur heard. Pulmonary/Chest: Effort normal and breath sounds normal. No respiratory distress. She has no decreased breath sounds. She has no wheezes. She has no rhonchi. She has no rales.  Abdominal: Soft. Bowel sounds are normal. She exhibits no distension. There is no tenderness. There is no rebound and no guarding.  Lymphadenopathy:    She has cervical adenopathy.  Neurological: She is alert and oriented to person, place, and time.  Skin: Skin is warm and dry. No rash noted. She is not diaphoretic. No erythema.       Assessment & Plan:  1. Acute upper respiratory infection Plenty of fluid. - benzonatate (TESSALON) 100 MG capsule; Take 1-2 capsules (100-200 mg total) by mouth 3 (three) times daily as needed for cough.  Dispense: 40 capsule; Refill: 0 - ipratropium (ATROVENT) 0.03 % nasal spray; Place 2 sprays into both nostrils 2 (two) times daily.  Dispense: 30 mL; Refill: 0 - chlorpheniramine-HYDROcodone (TUSSIONEX PENNKINETIC ER) 10-8 MG/5ML SUER; Take 5 mLs by mouth at bedtime as needed for cough.  Dispense: 75 mL; Refill: 0   Erdem Naas PA-C  [facility] 09/01/2015 6:24 PM

## 2015-09-16 ENCOUNTER — Other Ambulatory Visit: Payer: Self-pay

## 2015-09-16 ENCOUNTER — Ambulatory Visit: Payer: Self-pay

## 2015-09-23 ENCOUNTER — Ambulatory Visit: Payer: Self-pay

## 2015-09-23 ENCOUNTER — Other Ambulatory Visit: Payer: Self-pay

## 2015-09-28 ENCOUNTER — Other Ambulatory Visit: Payer: Self-pay | Admitting: Physician Assistant

## 2015-09-30 NOTE — Telephone Encounter (Signed)
Pharmacy called; says pt is requesting refill on Atrovent nasal spray. Please advise.

## 2015-11-08 ENCOUNTER — Other Ambulatory Visit: Payer: Self-pay

## 2015-11-08 MED ORDER — IPRATROPIUM BROMIDE 0.03 % NA SOLN: 2.0000 | Freq: Two times a day (BID) | NASAL | Status: AC

## 2018-08-23 ENCOUNTER — Other Ambulatory Visit: Payer: Self-pay | Admitting: Family Medicine

## 2018-08-23 DIAGNOSIS — Z1231 Encounter for screening mammogram for malignant neoplasm of breast: Secondary | ICD-10-CM

## 2018-09-27 ENCOUNTER — Ambulatory Visit: Payer: Self-pay

## 2018-11-01 ENCOUNTER — Ambulatory Visit: Payer: Self-pay

## 2018-12-09 ENCOUNTER — Ambulatory Visit: Payer: Self-pay

## 2018-12-31 ENCOUNTER — Ambulatory Visit: Payer: Self-pay

## 2019-01-23 ENCOUNTER — Ambulatory Visit: Payer: Self-pay

## 2019-02-20 ENCOUNTER — Ambulatory Visit: Payer: Self-pay

## 2020-12-17 ENCOUNTER — Other Ambulatory Visit: Payer: Self-pay | Admitting: Family Medicine

## 2020-12-17 DIAGNOSIS — Z1231 Encounter for screening mammogram for malignant neoplasm of breast: Secondary | ICD-10-CM

## 2020-12-27 ENCOUNTER — Ambulatory Visit: Payer: Self-pay

## 2021-02-10 ENCOUNTER — Ambulatory Visit
Admission: RE | Admit: 2021-02-10 | Discharge: 2021-02-10 | Disposition: A | Discharge status: Home / Self Care | Payer: BC Managed Care – PPO | Source: Ambulatory Visit | Attending: Family Medicine | Admitting: Family Medicine

## 2021-02-10 ENCOUNTER — Other Ambulatory Visit: Payer: Self-pay

## 2021-02-10 DIAGNOSIS — Z1231 Encounter for screening mammogram for malignant neoplasm of breast: Secondary | ICD-10-CM

## 2022-04-14 ENCOUNTER — Other Ambulatory Visit: Payer: Self-pay | Admitting: Family Medicine

## 2022-04-14 DIAGNOSIS — Z1231 Encounter for screening mammogram for malignant neoplasm of breast: Secondary | ICD-10-CM

## 2022-04-19 ENCOUNTER — Ambulatory Visit: Payer: BC Managed Care – PPO

## 2022-05-03 ENCOUNTER — Ambulatory Visit: Payer: BC Managed Care – PPO

## 2022-05-11 ENCOUNTER — Other Ambulatory Visit: Payer: Self-pay | Admitting: Family Medicine

## 2022-05-11 DIAGNOSIS — Z87891 Personal history of nicotine dependence: Secondary | ICD-10-CM

## 2022-05-17 ENCOUNTER — Ambulatory Visit: Payer: BC Managed Care – PPO

## 2022-06-02 ENCOUNTER — Ambulatory Visit
Admission: RE | Admit: 2022-06-02 | Discharge: 2022-06-02 | Disposition: A | Discharge status: Home / Self Care | Payer: BC Managed Care – PPO | Source: Ambulatory Visit | Attending: Family Medicine | Admitting: Family Medicine

## 2022-06-02 DIAGNOSIS — Z1231 Encounter for screening mammogram for malignant neoplasm of breast: Secondary | ICD-10-CM

## 2022-06-16 ENCOUNTER — Inpatient Hospital Stay: Admission: RE | Admit: 2022-06-16 | Payer: BC Managed Care – PPO | Source: Ambulatory Visit

## 2022-07-14 ENCOUNTER — Other Ambulatory Visit: Payer: BC Managed Care – PPO

## 2023-05-03 ENCOUNTER — Other Ambulatory Visit: Payer: Self-pay | Admitting: Family Medicine

## 2023-05-03 DIAGNOSIS — Z1231 Encounter for screening mammogram for malignant neoplasm of breast: Secondary | ICD-10-CM

## 2023-06-05 ENCOUNTER — Ambulatory Visit: Payer: BC Managed Care – PPO

## 2023-06-08 ENCOUNTER — Ambulatory Visit: Payer: BC Managed Care – PPO

## 2023-06-12 ENCOUNTER — Ambulatory Visit: Payer: BC Managed Care – PPO

## 2024-03-07 ENCOUNTER — Ambulatory Visit

## 2024-03-12 ENCOUNTER — Ambulatory Visit

## 2024-04-03 ENCOUNTER — Ambulatory Visit
Admission: RE | Admit: 2024-04-03 | Discharge: 2024-04-03 | Disposition: A | Discharge status: Home / Self Care | Source: Ambulatory Visit | Attending: Family Medicine | Admitting: Family Medicine

## 2024-04-03 DIAGNOSIS — Z1231 Encounter for screening mammogram for malignant neoplasm of breast: Secondary | ICD-10-CM

## 2024-04-10 ENCOUNTER — Other Ambulatory Visit: Payer: Self-pay | Admitting: Family Medicine

## 2024-04-10 DIAGNOSIS — R928 Other abnormal and inconclusive findings on diagnostic imaging of breast: Secondary | ICD-10-CM

## 2024-05-07 ENCOUNTER — Encounter

## 2024-05-07 ENCOUNTER — Other Ambulatory Visit

## 2024-05-09 ENCOUNTER — Encounter

## 2024-05-09 ENCOUNTER — Other Ambulatory Visit

## 2024-05-30 ENCOUNTER — Encounter: Payer: Self-pay | Admitting: Family Medicine

## 2024-06-03 ENCOUNTER — Ambulatory Visit
Admission: RE | Admit: 2024-06-03 | Discharge: 2024-06-03 | Disposition: A | Discharge status: Home / Self Care | Source: Ambulatory Visit | Attending: Family Medicine | Admitting: Family Medicine

## 2024-06-03 DIAGNOSIS — R928 Other abnormal and inconclusive findings on diagnostic imaging of breast: Secondary | ICD-10-CM
